# Patient Record
Sex: Female | Born: 2000
Health system: Southern US, Community
[De-identification: ages and names within clinical notes are randomized; demographics above are authoritative.]

## PROBLEM LIST (undated history)

## (undated) DIAGNOSIS — J302 Other seasonal allergic rhinitis: Secondary | ICD-10-CM

## (undated) DIAGNOSIS — F329 Major depressive disorder, single episode, unspecified: Secondary | ICD-10-CM

## (undated) DIAGNOSIS — F32A Depression, unspecified: Secondary | ICD-10-CM

## (undated) HISTORY — DX: Major depressive disorder, single episode, unspecified: F32.9

## (undated) HISTORY — DX: Depression, unspecified: F32.A

## (undated) HISTORY — DX: Other seasonal allergic rhinitis: J30.2

---
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
## 2018-01-10 DIAGNOSIS — N898 Other specified noninflammatory disorders of vagina: Secondary | ICD-10-CM | POA: Diagnosis not present

## 2018-11-17 DIAGNOSIS — Z3201 Encounter for pregnancy test, result positive: Secondary | ICD-10-CM | POA: Diagnosis not present

## 2018-11-17 DIAGNOSIS — R112 Nausea with vomiting, unspecified: Secondary | ICD-10-CM | POA: Diagnosis not present

## 2019-04-30 ENCOUNTER — Encounter: Payer: Self-pay | Admitting: Primary Care

## 2019-04-30 ENCOUNTER — Ambulatory Visit (INDEPENDENT_AMBULATORY_CARE_PROVIDER_SITE_OTHER): Payer: Self-pay | Admitting: Primary Care

## 2019-04-30 ENCOUNTER — Other Ambulatory Visit: Payer: Self-pay

## 2019-04-30 DIAGNOSIS — K219 Gastro-esophageal reflux disease without esophagitis: Secondary | ICD-10-CM | POA: Insufficient documentation

## 2019-04-30 NOTE — Progress Notes (Signed)
Subjective:    Patient ID: Monique Sutton, female    DOB: 10-02-2001, 18 y.o.   MRN: [id number]  HPI  Virtual Visit via Video Note  I connected with Monique Lookourtney R Sutton on 04/30/19 at 10:40 AM EDT by a video enabled telemedicine application and verified that I am speaking with the correct person using two identifiers.  Location: Patient: Home Provider: Office   I discussed the limitations of evaluation and management by telemedicine and the availability of in person appointments. The patient expressed understanding and agreed to proceed.  History of Present Illness:  Monique Sutton is a 18 year old female who presents today with a chief complaint of nausea.  She will wake every morning and will feel nauseated, will sometimes gag and vomit "clear liquid". She doesn't feel nauseated during the day. She also endorses intermittent esophageal burning with some epigastric discomfort at times. She has a history of GERD as a child and had similar symptoms. She was treated at some point but doesn't remember what she took. Symptoms have been present for the last 6+ months.   She denies diarrhea, fevers, abdominal pain, feeling sick.   Observations/Objective:  Alert and oriented. Appears well, not sickly. No distress. Speaking in complete sentences.   Assessment and Plan:  Symptoms suspicious for GERD, lacks symptoms for acute infection. Discussed triggers for GERD including food, beverages, and also to avoid laying flat within 2 hours after eating at night.  Will have her try famotidine 20 mg daily for several weeks then stop. Discussed that she may resume if symptoms return.  She will call and update if she cannot find Pepcid, consider low dose omeprazole at that point.   Follow Up Instructions:  Start taking famotidine (Pepcid) 20 mg tablets for heartburn. Take 1 tablet by mouth once or twice daily for at least 2-4 weeks.   If your symptoms return after you stop then resume  daily.  Please update me as discussed.  It was a pleasure to see you today! Mayra ReelKate Aronda Burford, NP-C    I discussed the assessment and treatment plan with the patient. The patient was provided an opportunity to ask questions and all were answered. The patient agreed with the plan and demonstrated an understanding of the instructions.   The patient was advised to call back or seek an in-person evaluation if the symptoms worsen or if the condition fails to improve as anticipated.     Doreene NestKatherine K Elizeth Weinrich, NP    Review of Systems  Constitutional: Negative for fever.  Respiratory: Negative for cough.   Gastrointestinal: Negative for abdominal pain.       Esophageal burning, nausea in the morning only       Past Medical History:  Diagnosis Date  . Depression   . Seasonal allergies      Social History   Socioeconomic History  . Marital status: Single    Spouse name: Not on file  . Number of children: Not on file  . Years of education: Not on file  . Highest education level: Not on file  Occupational History  . Not on file  Social Needs  . Financial resource strain: Not on file  . Food insecurity:    Worry: Not on file    Inability: Not on file  . Transportation needs:    Medical: Not on file    Non-medical: Not on file  Tobacco Use  . Smoking status: Passive Smoke Exposure - Never Smoker  . Smokeless tobacco:  Never Used  Substance and Sexual Activity  . Alcohol use: No    Alcohol/week: 0.0 standard drinks  . Drug use: No  . Sexual activity: Not on file  Lifestyle  . Physical activity:    Days per week: Not on file    Minutes per session: Not on file  . Stress: Not on file  Relationships  . Social connections:    Talks on phone: Not on file    Gets together: Not on file    Attends religious service: Not on file    Active member of club or organization: Not on file    Attends meetings of clubs or organizations: Not on file    Relationship status: Not on file  .  Intimate partner violence:    Fear of current or ex partner: Not on file    Emotionally abused: Not on file    Physically abused: Not on file    Forced sexual activity: Not on file  Other Topics Concern  . Not on file  Social History Narrative  . Not on file    No past surgical history on file.  No family history on file.  No Known Allergies  Current Outpatient Medications on File Prior to Visit  Medication Sig Dispense Refill  . FLUoxetine (PROZAC) 20 MG capsule Take 1 capsule (20 mg total) by mouth daily. 30 capsule 0   No current facility-administered medications on file prior to visit.     Temp (!) 91.8 F (33.2 C) (Temporal)   Wt 91 lb (41.3 kg)   LMP 04/29/2019    Objective:   Physical Exam  Constitutional: She is oriented to person, place, and time. She appears well-nourished. She does not have a sickly appearance. She does not appear ill.  Respiratory: Effort normal.  Neurological: She is alert and oriented to person, place, and time.  Psychiatric: She has a normal mood and affect.           Assessment & Plan:

## 2019-04-30 NOTE — Patient Instructions (Signed)
Start taking famotidine (Pepcid) 20 mg tablets for heartburn. Take 1 tablet by mouth once or twice daily for at least 2-4 weeks.   If your symptoms return after you stop then resume daily.  Please update me as discussed.  It was a pleasure to see you today! Mayra Reel, NP-C

## 2019-04-30 NOTE — Assessment & Plan Note (Signed)
Symptoms suspicious for GERD, lacks symptoms for acute infection. Discussed triggers for GERD including food, beverages, and also to avoid laying flat within 2 hours after eating at night.  Will have her try famotidine 20 mg daily for several weeks then stop. Discussed that she may resume if symptoms return.  She will call and update if she cannot find Pepcid, consider low dose omeprazole at that point.

## 2019-07-16 ENCOUNTER — Encounter: Payer: Self-pay | Admitting: Primary Care

## 2020-05-04 ENCOUNTER — Ambulatory Visit (INDEPENDENT_AMBULATORY_CARE_PROVIDER_SITE_OTHER): Payer: 59 | Admitting: Primary Care

## 2020-05-04 ENCOUNTER — Other Ambulatory Visit: Payer: Self-pay

## 2020-05-04 ENCOUNTER — Encounter: Payer: Self-pay | Admitting: Primary Care

## 2020-05-04 VITALS — BP 110/72 | HR 123 | Temp 97.0°F | Ht 61.42 in | Wt 83.8 lb

## 2020-05-04 DIAGNOSIS — F411 Generalized anxiety disorder: Secondary | ICD-10-CM

## 2020-05-04 DIAGNOSIS — Z0000 Encounter for general adult medical examination without abnormal findings: Secondary | ICD-10-CM | POA: Diagnosis not present

## 2020-05-04 MED ORDER — ESCITALOPRAM OXALATE 10 MG PO TABS: 10.0000 mg | ORAL_TABLET | Freq: Every day | ORAL | 1 refills | Status: DC

## 2020-05-04 NOTE — Assessment & Plan Note (Signed)
Currently active with GAD 7 score of 17. She's unfortunately lost several close family members over the last year.   Would not recommend fluoxetine for anxiety symptoms, will trial Lexapro 10 mg. Patient is to take 1/2 tablet daily for 8 days, then advance to 1 full tablet thereafter. We discussed possible side effects of headache, GI upset, drowsiness, and SI/HI. If thoughts of SI/HI develop, we discussed to present to the emergency immediately. Patient verbalized understanding.   Follow up in 6 weeks for re-evaluation.

## 2020-05-04 NOTE — Assessment & Plan Note (Signed)
Immunizations UTD. Pap smear due next year at age 19.  Encouraged a healthy diet. Exam today benign. Labs pending.

## 2020-05-04 NOTE — Patient Instructions (Addendum)
COVID-19 Vaccine Information can be found at: [email or web address] For questions related to vaccine distribution or appointments, please email [email or web address] or call [phone].  COVID 19 [phone] in [place]  [phone] or [email or web address]  -[place]  [phone]  -[place] Vaccination  Stop by the lab prior to leaving today. I will notify you of your results once received.   Start escitalopram (Lexapro) 10 mg once daily for anxiety. Start by taking 1/2 tablet daily for 8 days, then increase to 1 full tablet thereafter.   Please schedule a follow up visit for 6 weeks as discussed.  It was a pleasure to see you today!   Preventive Care 34-27 Years Old, Female Preventive care refers to lifestyle choices and visits with your health care provider that can promote health and wellness. At this stage in your life, you may start seeing a primary care physician instead of a pediatrician. Your health care is now your responsibility. Preventive care for young adults includes:  A yearly physical exam. This is also called an annual wellness visit.  Regular dental and eye exams.  Immunizations.  Screening for certain conditions.  Healthy lifestyle choices, such as diet and exercise. What can I expect for my preventive care visit? Physical exam Your health care provider may check:  Height and weight. These may be used to calculate body mass index (BMI), which is a measurement that tells if you are at a healthy weight.  Heart rate and blood pressure.  Body temperature. Counseling Your health care provider may ask you questions about:  Past medical problems and family medical history.  Alcohol, tobacco, and drug use.  Home and relationship well-being.  Access to firearms.  Emotional well-being.  Diet, exercise, and sleep habits.  Sexual activity and sexual  health.  Method of birth control.  Menstrual cycle.  Pregnancy history. What immunizations do I need?  Influenza (flu) vaccine  This is recommended every year. Tetanus, diphtheria, and pertussis (Tdap) vaccine  You may need a Td booster every 10 years. Varicella (chickenpox) vaccine  You may need this vaccine if you have not already been vaccinated. Human papillomavirus (HPV) vaccine  If recommended by your health care provider, you may need three doses over 6 months. Measles, mumps, and rubella (MMR) vaccine  You may need at least one dose of MMR. You may also need a second dose. Meningococcal conjugate (MenACWY) vaccine  One dose is recommended if you are 27-18 years old and a Market researcher living in a residence hall, or if you have one of several medical conditions. You may also need additional booster doses. Pneumococcal conjugate (PCV13) vaccine  You may need this if you have certain conditions and were not previously vaccinated. Pneumococcal polysaccharide (PPSV23) vaccine  You may need one or two doses if you smoke cigarettes or if you have certain conditions. Hepatitis A vaccine  You may need this if you have certain conditions or if you travel or work in places where you may be exposed to hepatitis A. Hepatitis B vaccine  You may need this if you have certain conditions or if you travel or work in places where you may be exposed to hepatitis B. Haemophilus influenzae type b (Hib) vaccine  You may need this if you have certain risk factors. You may receive vaccines as individual doses or as more than one vaccine together in one shot (combination vaccines). Talk with your health care provider about the risks and benefits of combination vaccines.  What tests do I need? Blood tests  Lipid and cholesterol levels. These may be checked every 5 years starting at age 11.  Hepatitis C test.  Hepatitis B test. Screening  Pelvic exam and Pap test. This  may be done every 3 years starting at age 51.  Sexually transmitted disease (STD) testing, if you are at risk.  BRCA-related cancer screening. This may be done if you have a family history of breast, ovarian, tubal, or peritoneal cancers. Other tests  Tuberculosis skin test.  Vision and hearing tests.  Skin exam.  Breast exam. Follow these instructions at home: Eating and drinking   Eat a diet that includes fresh fruits and vegetables, whole grains, lean protein, and low-fat dairy products.  Drink enough fluid to keep your urine pale yellow.  Do not drink alcohol if: ? Your health care provider tells you not to drink. ? You are pregnant, may be pregnant, or are planning to become pregnant. ? You are under the legal drinking age. In the [place] the legal drinking age is 55.  If you drink alcohol: ? Limit how much you have to 0-1 drink a day. ? Be aware of how much alcohol is in your drink. In the [place] one drink equals one 12 oz bottle of beer (355 mL), one 5 oz glass of wine (148 mL), or one 1 oz glass of hard liquor (44 mL). Lifestyle  Take daily care of your teeth and gums.  Stay active. Exercise at least 30 minutes 5 or more days of the week.  Do not use any products that contain nicotine or tobacco, such as cigarettes, e-cigarettes, and chewing tobacco. If you need help quitting, ask your health care provider.  Do not use drugs.  If you are sexually active, practice safe sex. Use a condom or other form of birth control (contraception) in order to prevent pregnancy and STIs (sexually transmitted infections). If you plan to become pregnant, see your health care provider for a pre-conception visit.  Find healthy ways to cope with stress, such as: ? Meditation, yoga, or listening to music. ? Journaling. ? Talking to a trusted person. ? Spending time with friends and family. Safety  Always wear your seat belt while driving or riding in a vehicle.  Do not drive if you  have been drinking alcohol. Do not ride with someone who has been drinking.  Do not drive when you are tired or distracted. Do not text while driving.  Wear a helmet and other protective equipment during sports activities.  If you have firearms in your house, make sure you follow all gun safety procedures.  Seek help if you have been bullied, physically abused, or sexually abused.  Use the Internet responsibly to avoid dangers such as online bullying and online sex predators. What's next?  Go to your health care provider once a year for a well check visit.  Ask your health care provider how often you should have your eyes and teeth checked.  Stay up to date on all vaccines. This information is not intended to replace advice given to you by your health care provider. Make sure you discuss any questions you have with your health care provider. Document Revised: 11/08/2018 Document Reviewed: 11/08/2018 Elsevier Patient Education  2020 [employer].

## 2020-05-04 NOTE — Progress Notes (Signed)
Subjective:    Patient ID: RYN PEINE, female    DOB: June 06, 2001, 18 y.o.   MRN: [id number]  HPI  This visit occurred during the SARS-CoV-2 public health emergency.  Safety protocols were in place, including screening questions prior to the visit, additional usage of staff PPE, and extensive cleaning of exam room while observing appropriate contact time as indicated for disinfecting solutions.   Ms. Borenstein is a 19 year old female who presents today for complete physical.  She would like to discuss going back on medication for anxiety. Some depression symptoms but mostly struggling with anxiety. She had a very tough year in 2020 as she lost her father, grandfather, and several animals. Symptoms include panic attacks, worry, feeling nervous. GAD 7 score of 17 and PHQ 9 score of 7 today. She denies SI/HI.  Immunizations: -Tetanus: Completed in 2012 -Influenza: Did not complete last season  -Covid-19: Will get soon. -HPV: Completed series.  Diet: She endorses a poor diet.  Exercise: She is not exercising.  Eye exam: No recent exam. Dental exam: Completes semi-annually   BP Readings from Last 3 Encounters:  05/04/20 110/72  10/09/17 (!) 100/64 (15 %, Z = -1.04 /  47 %, Z = -0.08)*  08/21/17 (!) 100/64 (15 %, Z = -1.02 /  47 %, Z = -0.08)*   *BP percentiles are based on the 2017 AAP Clinical Practice Guideline for girls     Review of Systems  Constitutional: Negative for unexpected weight change.  HENT: Negative for rhinorrhea.   Respiratory: Negative for shortness of breath.   Cardiovascular: Negative for chest pain.  Gastrointestinal: Negative for constipation and diarrhea.  Genitourinary: Negative for difficulty urinating and menstrual problem.  Musculoskeletal: Negative for arthralgias and myalgias.  Skin: Negative for rash.  Allergic/Immunologic: Positive for environmental allergies.  Neurological: Negative for dizziness, numbness and headaches.    Psychiatric/Behavioral: The patient is nervous/anxious.        Past Medical History:  Diagnosis Date  . Depression   . Seasonal allergies      Social History   Socioeconomic History  . Marital status: Single    Spouse name: Not on file  . Number of children: Not on file  . Years of education: Not on file  . Highest education level: Not on file  Occupational History  . Not on file  Tobacco Use  . Smoking status: Passive Smoke Exposure - Never Smoker  . Smokeless tobacco: Never Used  Substance and Sexual Activity  . Alcohol use: No    Alcohol/week: 0.0 standard drinks  . Drug use: No  . Sexual activity: Not on file  Other Topics Concern  . Not on file  Social History Narrative  . Not on file   Social Determinants of Health   Financial Resource Strain:   . Difficulty of Paying Living Expenses:   Food Insecurity:   . Worried About Programme researcher, broadcasting/film/video in the Last Year:   . Barista in the Last Year:   Transportation Needs:   . Freight forwarder (Medical):   Marland Kitchen Lack of Transportation (Non-Medical):   Physical Activity:   . Days of Exercise per Week:   . Minutes of Exercise per Session:   Stress:   . Feeling of Stress :   Social Connections:   . Frequency of Communication with Friends and Family:   . Frequency of Social Gatherings with Friends and Family:   . Attends Religious Services:   .  Active Member of Clubs or Organizations:   . Attends Archivist Meetings:   Marland Kitchen Marital Status:   Intimate Partner Violence:   . Fear of Current or Ex-Partner:   . Emotionally Abused:   Marland Kitchen Physically Abused:   . Sexually Abused:     No past surgical history on file.  No family history on file.  No Known Allergies  No current outpatient medications on file prior to visit.   No current facility-administered medications on file prior to visit.    BP 110/72   Pulse (!) 123   Temp (!) 97 F (36.1 C) (Temporal)   Ht 5' 1.42" (1.56 m)   Wt 83 lb  12.8 oz (38 kg)   LMP 04/28/2020   SpO2 97%   BMI 15.62 kg/m    Objective:   Physical Exam  Constitutional: She is oriented to person, place, and time. She appears well-nourished.  HENT:  Right Ear: Tympanic membrane and ear canal normal.  Left Ear: Tympanic membrane and ear canal normal.  Mouth/Throat: Oropharynx is clear and moist.  Eyes: Pupils are equal, round, and reactive to light. EOM are normal.  Cardiovascular: Normal rate and regular rhythm.  Respiratory: Effort normal and breath sounds normal.  GI: Soft. Bowel sounds are normal. There is no abdominal tenderness.  Musculoskeletal:        General: Normal range of motion.     Cervical back: Neck supple.  Neurological: She is alert and oriented to person, place, and time. No cranial nerve deficit.  Reflex Scores:      Patellar reflexes are 2+ on the right side and 2+ on the left side. Skin: Skin is warm and dry.  Psychiatric: She has a normal mood and affect.           Assessment & Plan:

## 2020-05-05 LAB — LIPID PANEL
Cholesterol: 133 mg/dL (ref 0–200)
HDL: 58.1 mg/dL (ref >39.00)
LDL Cholesterol: 68 mg/dL (ref 0–99)
NonHDL: 74.91
Total CHOL/HDL Ratio: 2
Triglycerides: 36 mg/dL (ref 0.0–149.0)
VLDL: 7.2 mg/dL (ref 0.0–40.0)

## 2020-05-05 LAB — CBC
HCT: 39.2 % (ref 36.0–46.0)
Hemoglobin: 13.5 g/dL (ref 12.0–15.0)
MCHC: 34.3 g/dL (ref 30.0–36.0)
MCV: 87.7 fl (ref 78.0–100.0)
Platelets: 199 10*3/uL (ref 150.0–400.0)
RBC: 4.47 Mil/uL (ref 3.87–5.11)
RDW: 13.2 % (ref 11.5–14.6)
WBC: 5.2 10*3/uL (ref 4.5–10.5)

## 2020-05-05 LAB — COMPREHENSIVE METABOLIC PANEL
ALT: 10 U/L (ref 0–35)
AST: 13 U/L (ref 0–37)
Albumin: 4.6 g/dL (ref 3.5–5.2)
Alkaline Phosphatase: 52 U/L (ref 39–117)
BUN: 9 mg/dL (ref 6–23)
CO2: 25 mEq/L (ref 19–32)
Calcium: 9.2 mg/dL (ref 8.4–10.5)
Chloride: 106 mEq/L (ref 96–112)
Creatinine, Ser: 0.66 mg/dL (ref 0.40–1.20)
GFR: 113.58 mL/min (ref >60.00)
Glucose, Bld: 90 mg/dL (ref 70–99)
Potassium: 3.6 mEq/L (ref 3.5–5.1)
Sodium: 137 mEq/L (ref 135–145)
Total Bilirubin: 0.7 mg/dL (ref 0.2–1.2)
Total Protein: 6.8 g/dL (ref 6.0–8.3)

## 2020-06-15 ENCOUNTER — Ambulatory Visit: Payer: Medicaid Other | Admitting: Primary Care

## 2020-06-16 ENCOUNTER — Other Ambulatory Visit: Payer: Self-pay

## 2020-06-16 ENCOUNTER — Ambulatory Visit (INDEPENDENT_AMBULATORY_CARE_PROVIDER_SITE_OTHER): Payer: 59 | Admitting: Primary Care

## 2020-06-16 ENCOUNTER — Encounter: Payer: Self-pay | Admitting: Primary Care

## 2020-06-16 DIAGNOSIS — F3342 Major depressive disorder, recurrent, in full remission: Secondary | ICD-10-CM | POA: Diagnosis not present

## 2020-06-16 DIAGNOSIS — F411 Generalized anxiety disorder: Secondary | ICD-10-CM | POA: Diagnosis not present

## 2020-06-16 NOTE — Assessment & Plan Note (Signed)
Improved on Lexapro 10 mg, agree to a slight dose increase of 15 mg. Discussed that she could go up to 20 mg if needed.  She has plenty of medication in her current bottle, she will start with 1 and 1/2 tablets and update in a few weeks. Will need to send either a refill of Lexapro 10 mg with dosing of 1 and 1/2 tablets, or Lexapro 20 mg.   Denies SI/HI.  

## 2020-06-16 NOTE — Progress Notes (Signed)
Subjective:    Patient ID: Monique Sutton, female    DOB: 02-15-2001, 18 y.o.   MRN: [id number]  HPI  This visit occurred during the SARS-CoV-2 public health emergency.  Safety protocols were in place, including screening questions prior to the visit, additional usage of staff PPE, and extensive cleaning of exam room while observing appropriate contact time as indicated for disinfecting solutions.   Monique Sutton is a 19 year old female who presents today for follow up of anxiety and depression.   She was last evaluated in early June 2021 for symptoms of panic attacks, worry, feeling nervous, sadness due to loss of loved ones. GAD 7 score of 17 and PHQ 9 score of 7 so we initiated Lexapro 10 mg and asked her to follow up today.  Since her last visit she's doing better. She's changed jobs which has helped reduce a lot of symptoms. Positive effects from Lexapro include feeling happier, feeling less angry, more patience. She continues to notice irritability, some worry, some anxiousness. She would like to continue Lexapro but would like a small dose increase to 15 mg. She feels that she may further benefit.   She denies SI/HI, GI upset, headaches.   Review of Systems  Gastrointestinal: Negative for nausea.  Neurological: Negative for dizziness and headaches.  Psychiatric/Behavioral: Negative for suicidal ideas.       See HPI       Past Medical History:  Diagnosis Date  . Depression   . Seasonal allergies      Social History   Socioeconomic History  . Marital status: Single    Spouse name: Not on file  . Number of children: Not on file  . Years of education: Not on file  . Highest education level: Not on file  Occupational History  . Not on file  Tobacco Use  . Smoking status: Passive Smoke Exposure - Never Smoker  . Smokeless tobacco: Never Used  Substance and Sexual Activity  . Alcohol use: No    Alcohol/week: 0.0 standard drinks  . Drug use: No  . Sexual activity:  Not on file  Other Topics Concern  . Not on file  Social History Narrative  . Not on file   Social Determinants of Health   Financial Resource Strain:   . Difficulty of Paying Living Expenses:   Food Insecurity:   . Worried About Programme researcher, broadcasting/film/video in the Last Year:   . Barista in the Last Year:   Transportation Needs:   . Freight forwarder (Medical):   Marland Kitchen Lack of Transportation (Non-Medical):   Physical Activity:   . Days of Exercise per Week:   . Minutes of Exercise per Session:   Stress:   . Feeling of Stress :   Social Connections:   . Frequency of Communication with Friends and Family:   . Frequency of Social Gatherings with Friends and Family:   . Attends Religious Services:   . Active Member of Clubs or Organizations:   . Attends Banker Meetings:   Marland Kitchen Marital Status:   Intimate Partner Violence:   . Fear of Current or Ex-Partner:   . Emotionally Abused:   Marland Kitchen Physically Abused:   . Sexually Abused:     No past surgical history on file.  No family history on file.  No Known Allergies  Current Outpatient Medications on File Prior to Visit  Medication Sig Dispense Refill  . escitalopram (LEXAPRO) 10 MG tablet Take  1 tablet (10 mg total) by mouth daily. For anxiety and depression. 30 tablet 1   No current facility-administered medications on file prior to visit.    BP 102/70   Pulse 88   Temp (!) 97.2 F (36.2 C) (Temporal)   Ht 5' 1.5" (1.562 m)   Wt 84 lb 4 oz (38.2 kg)   LMP 05/28/2020   SpO2 98%   BMI 15.66 kg/m    Objective:   Physical Exam Cardiovascular:     Rate and Rhythm: Normal rate and regular rhythm.  Pulmonary:     Effort: Pulmonary effort is normal.     Breath sounds: Normal breath sounds.  Musculoskeletal:     Cervical back: Neck supple.  Skin:    General: Skin is warm and dry.  Psychiatric:        Mood and Affect: Mood normal.            Assessment & Plan:

## 2020-06-16 NOTE — Assessment & Plan Note (Signed)
Improved on Lexapro 10 mg, agree to a slight dose increase of 15 mg. Discussed that she could go up to 20 mg if needed.  She has plenty of medication in her current bottle, she will start with 1 and 1/2 tablets and update in a few weeks. Will need to send either a refill of Lexapro 10 mg with dosing of 1 and 1/2 tablets, or Lexapro 20 mg.   Denies SI/HI.

## 2020-06-16 NOTE — Patient Instructions (Signed)
We've increased the dose of your Lexapro to 15 mg. You can take 1 and 1/2 tablets daily.   Please update me in a few weeks, we can increase to 20 mg if needed.  It was a pleasure to see you today!

## 2020-06-29 ENCOUNTER — Other Ambulatory Visit: Payer: Self-pay | Admitting: Primary Care

## 2020-07-03 ENCOUNTER — Other Ambulatory Visit: Payer: Self-pay | Admitting: Primary Care

## 2020-07-06 ENCOUNTER — Other Ambulatory Visit: Payer: Self-pay | Admitting: Primary Care

## 2020-07-06 DIAGNOSIS — F411 Generalized anxiety disorder: Secondary | ICD-10-CM

## 2020-07-06 MED ORDER — ESCITALOPRAM OXALATE 10 MG PO TABS: 10.0000 mg | ORAL_TABLET | Freq: Every day | ORAL | 0 refills | Status: DC

## 2020-07-08 ENCOUNTER — Telehealth: Payer: Self-pay | Admitting: *Deleted

## 2020-07-08 NOTE — Telephone Encounter (Signed)
Spoken to patient and stated that citalopram and escitalopram are two different medication. I went into detail when the change was and discuss the supposed regime.    Confirm with patient that she is taking escitalopram 15 mg which is a 1 and 1/2 tablet. Patient stated yes, she is and only taking 1 and 1/2 week but will update.

## 2020-07-08 NOTE — Telephone Encounter (Signed)
Patient left a voicemail stating that she has questions about the script that was sent in for Escitalopram. Patient requested a call back to discuss.

## 2020-09-15 ENCOUNTER — Other Ambulatory Visit: Payer: Self-pay | Admitting: Primary Care

## 2020-09-15 DIAGNOSIS — F411 Generalized anxiety disorder: Secondary | ICD-10-CM

## 2020-09-16 NOTE — Telephone Encounter (Signed)
Called mail box full will send my chart message to see how she is taking.

## 2020-09-19 ENCOUNTER — Other Ambulatory Visit: Payer: Self-pay | Admitting: Primary Care

## 2020-10-02 ENCOUNTER — Telehealth: Payer: Self-pay | Admitting: Primary Care

## 2020-10-02 NOTE — Telephone Encounter (Signed)
Pt mom called in due to she received a collection bill and nothing was sent to the mom insurance and the claims are from 04/2020 and 05/2020. Please advise

## 2020-12-23 ENCOUNTER — Other Ambulatory Visit: Payer: Medicaid Other

## 2020-12-23 DIAGNOSIS — Z20822 Contact with and (suspected) exposure to covid-19: Secondary | ICD-10-CM

## 2020-12-24 LAB — SARS-COV-2, NAA 2 DAY TAT

## 2020-12-24 LAB — NOVEL CORONAVIRUS, NAA: SARS-CoV-2, NAA: DETECTED — AB

## 2020-12-28 NOTE — Telephone Encounter (Signed)
After investigation and discussing with billing, I have sent an email over to charge correction to have them refile the claims with the correct insurance company as well as remove from collections.

## 2020-12-29 NOTE — Telephone Encounter (Signed)
Patient's mother is aware that this has been refiled with the insurance

## 2021-01-20 ENCOUNTER — Other Ambulatory Visit: Payer: Self-pay

## 2021-01-20 DIAGNOSIS — F411 Generalized anxiety disorder: Secondary | ICD-10-CM

## 2021-01-20 MED ORDER — ESCITALOPRAM OXALATE 10 MG PO TABS: 15.0000 mg | ORAL_TABLET | Freq: Every day | ORAL | 1 refills | Status: DC

## 2021-04-30 ENCOUNTER — Other Ambulatory Visit: Payer: Self-pay

## 2021-04-30 DIAGNOSIS — F411 Generalized anxiety disorder: Secondary | ICD-10-CM

## 2021-04-30 MED ORDER — ESCITALOPRAM OXALATE 20 MG PO TABS
20.0000 mg | ORAL_TABLET | Freq: Every day | ORAL | 0 refills | Status: DC
  Filled 2021-04-30 – 2021-05-19 (×2): qty 90, 90d supply, fill #0

## 2021-05-11 ENCOUNTER — Other Ambulatory Visit: Payer: Self-pay

## 2021-05-11 ENCOUNTER — Ambulatory Visit: Payer: Medicaid Other | Admitting: Primary Care

## 2021-05-19 ENCOUNTER — Other Ambulatory Visit: Payer: Self-pay

## 2021-11-23 NOTE — Telephone Encounter (Signed)
Noted. Patient was notified via MyChart in June 2022 that she needed to set up a follow up visit for July 2022 and she did not.  See response to MyChart message.

## 2021-11-30 NOTE — Telephone Encounter (Signed)
Joellen, will you get her scheduled for 12/02/21 at 3:20 slot. I put a hold on the time with her name.

## 2021-12-01 NOTE — Telephone Encounter (Signed)
Appointment made. No further action needed

## 2021-12-02 ENCOUNTER — Other Ambulatory Visit: Payer: Self-pay

## 2021-12-02 ENCOUNTER — Telehealth: Payer: BC Managed Care – PPO | Admitting: Primary Care

## 2022-01-24 ENCOUNTER — Encounter: Payer: BC Managed Care – PPO | Admitting: Adult Health

## 2022-08-01 ENCOUNTER — Other Ambulatory Visit: Payer: Self-pay

## 2022-08-01 MED ORDER — ONDANSETRON 4 MG PO TBDP
ORAL_TABLET | ORAL | 0 refills | Status: DC
  Filled 2022-08-01: qty 12, 4d supply, fill #0

## 2022-08-01 MED ORDER — CEFDINIR 300 MG PO CAPS
ORAL_CAPSULE | ORAL | 0 refills | Status: DC
  Filled 2022-08-01: qty 20, 10d supply, fill #0

## 2022-08-01 MED ORDER — MECLIZINE HCL 25 MG PO TABS
12.5000 mg | ORAL_TABLET | Freq: Three times a day (TID) | ORAL | 0 refills | Status: DC | PRN
  Filled 2022-08-01: qty 20, 7d supply, fill #0

## 2022-08-02 ENCOUNTER — Other Ambulatory Visit: Payer: Self-pay

## 2023-02-01 ENCOUNTER — Ambulatory Visit: Payer: BC Managed Care – PPO | Admitting: Family

## 2023-02-15 ENCOUNTER — Encounter: Payer: Self-pay | Admitting: Primary Care

## 2023-02-15 ENCOUNTER — Ambulatory Visit (INDEPENDENT_AMBULATORY_CARE_PROVIDER_SITE_OTHER): Payer: BC Managed Care – PPO | Admitting: Primary Care

## 2023-02-15 VITALS — BP 110/60 | HR 90 | Temp 98.9°F | Ht 61.5 in | Wt 87.2 lb

## 2023-02-15 DIAGNOSIS — F411 Generalized anxiety disorder: Secondary | ICD-10-CM | POA: Diagnosis not present

## 2023-02-15 DIAGNOSIS — F3342 Major depressive disorder, recurrent, in full remission: Secondary | ICD-10-CM | POA: Diagnosis not present

## 2023-02-15 MED ORDER — ESCITALOPRAM OXALATE 10 MG PO TABS: 10.0000 mg | ORAL_TABLET | Freq: Every day | ORAL | 3 refills | Status: DC

## 2023-02-15 NOTE — Patient Instructions (Signed)
Start Lexapro 10 mg for anxiety. Take 1/2 tablet by mouth once daily for about one week, then increase to 1 full tablet thereafter.   Please update me via MyChart in 1 month.  It was a pleasure to see you today!

## 2023-02-15 NOTE — Assessment & Plan Note (Signed)
Uncontrolled.  Will resume Lexapro at 10 mg daily. Discussed to start with 1/2 tablet daily x a few days, then increase to 1 full tablet.  She will update in about 4 weeks via MyChart.

## 2023-02-15 NOTE — Progress Notes (Signed)
Subjective:    Patient ID: Monique Sutton, female    DOB: [birth year], 22 y.o.   MRN: [id number]  Anxiety Symptoms include nervous/anxious behavior. Patient reports no chest pain or dizziness.      Monique Sutton is a very pleasant 22 y.o. female with a history of GAD, depression who presents today to discuss anxiety.   She has not been seen since July 2021.   She has not taken Lexapro in about one year but feels that she needs to resume. Symptoms include thinking worst case scenario, irritability, feeling anxious. Symptoms began around Fall 2023 after a long term relationship breakup.   She denies SI/HI, feeling depressed. She had no problems when taking Lexapro previously and felt well managed.    Review of Systems  Cardiovascular:  Negative for chest pain.  Neurological:  Negative for dizziness and headaches.  Psychiatric/Behavioral:  The patient is nervous/anxious.        See HPI         Past Medical History:  Diagnosis Date   Depression    Seasonal allergies     Social History   Socioeconomic History   Marital status: Single    Spouse name: Not on file   Number of children: Not on file   Years of education: Not on file   Highest education level: Not on file  Occupational History   Not on file  Tobacco Use   Smoking status: Some Days    Types: Cigarettes    Passive exposure: Yes   Smokeless tobacco: Never  Substance and Sexual Activity   Alcohol use: No    Alcohol/week: 0.0 standard drinks of alcohol   Drug use: Yes    Types: Marijuana   Sexual activity: Not on file  Other Topics Concern   Not on file  Social History Narrative   Not on file   Social Determinants of Health   Financial Resource Strain: Not on file  Food Insecurity: Not on file  Transportation Needs: Not on file  Physical Activity: Not on file  Stress: Not on file  Social Connections: Not on file  Intimate Partner Violence: Not on file    History reviewed. No  pertinent surgical history.  History reviewed. No pertinent family history.  No Known Allergies  No current outpatient medications on file prior to visit.   No current facility-administered medications on file prior to visit.    BP 110/60 (BP Location: Left Arm, Patient Position: Sitting)   Pulse 90   Temp 98.9 F (37.2 C) (Skin)   Ht 5' 1.5" (1.562 m)   Wt 87 lb 4 oz (39.6 kg)   LMP 01/25/2023   SpO2 98%   BMI 16.22 kg/m  Objective:   Physical Exam Cardiovascular:     Rate and Rhythm: Normal rate and regular rhythm.  Pulmonary:     Effort: Pulmonary effort is normal.     Breath sounds: Normal breath sounds.  Musculoskeletal:     Cervical back: Neck supple.  Skin:    General: Skin is warm and dry.           Assessment & Plan:  GAD (generalized anxiety disorder) Assessment & Plan: Uncontrolled.  Will resume Lexapro at 10 mg daily. Discussed to start with 1/2 tablet daily x a few days, then increase to 1 full tablet.  She will update in about 4 weeks via MyChart.  Orders: -     Escitalopram Oxalate; Take 1 tablet (10 mg total) by  mouth daily. For anxiety.  Dispense: 90 tablet; Refill: 3  Recurrent major depressive disorder, in full remission [facility]) Assessment & Plan: Denies concerns today.  Will resume Lexapro at 10 mg for anxiety.         Pleas Koch, NP

## 2023-02-15 NOTE — Assessment & Plan Note (Signed)
Denies concerns today.  Will resume Lexapro at 10 mg for anxiety.

## 2023-05-31 LAB — HM PAP SMEAR

## 2024-01-29 ENCOUNTER — Telehealth: Admitting: Physician Assistant

## 2024-01-29 DIAGNOSIS — R6889 Other general symptoms and signs: Secondary | ICD-10-CM

## 2024-01-29 DIAGNOSIS — J069 Acute upper respiratory infection, unspecified: Secondary | ICD-10-CM | POA: Diagnosis not present

## 2024-01-29 MED ORDER — FLUTICASONE PROPIONATE 50 MCG/ACT NA SUSP: 2.0000 | Freq: Every day | NASAL | 0 refills | Status: DC

## 2024-01-29 MED ORDER — OSELTAMIVIR PHOSPHATE 75 MG PO CAPS: 75.0000 mg | ORAL_CAPSULE | Freq: Two times a day (BID) | ORAL | 0 refills | Status: DC

## 2024-01-29 MED ORDER — BENZONATATE 100 MG PO CAPS
100.0000 mg | ORAL_CAPSULE | Freq: Three times a day (TID) | ORAL | 0 refills | Status: DC | PRN
Start: 2024-01-29 — End: 2024-04-25

## 2024-01-29 NOTE — Progress Notes (Signed)

## 2024-01-29 NOTE — Addendum Note (Signed)
 Addended by: Margaretann Loveless on: 01/29/2024 07:57 PM   Modules accepted: Orders

## 2024-03-22 ENCOUNTER — Other Ambulatory Visit: Payer: Self-pay | Admitting: Primary Care

## 2024-03-22 DIAGNOSIS — F411 Generalized anxiety disorder: Secondary | ICD-10-CM

## 2024-03-22 NOTE — Telephone Encounter (Signed)
 Called pt. No answer and Unable to leave voicemail.

## 2024-03-22 NOTE — Telephone Encounter (Signed)
 Patient is due for CPE/follow up, this will be required prior to any further refills.  Please schedule, thank you!

## 2024-04-13 ENCOUNTER — Other Ambulatory Visit: Payer: Self-pay | Admitting: Primary Care

## 2024-04-13 DIAGNOSIS — F411 Generalized anxiety disorder: Secondary | ICD-10-CM

## 2024-04-15 NOTE — Telephone Encounter (Signed)
 Patient is due for CPE/follow up, this will be required prior to any further refills.  Please schedule, thank you!

## 2024-04-16 NOTE — Telephone Encounter (Signed)
 Spoke to pt, scheduled cpe for 04/25/24

## 2024-04-25 ENCOUNTER — Encounter: Payer: Self-pay | Admitting: Primary Care

## 2024-04-25 ENCOUNTER — Ambulatory Visit (INDEPENDENT_AMBULATORY_CARE_PROVIDER_SITE_OTHER): Admitting: Primary Care

## 2024-04-25 VITALS — BP 116/70 | HR 103 | Temp 97.9°F | Ht 61.5 in | Wt 99.0 lb

## 2024-04-25 DIAGNOSIS — Z0000 Encounter for general adult medical examination without abnormal findings: Secondary | ICD-10-CM | POA: Diagnosis not present

## 2024-04-25 DIAGNOSIS — F411 Generalized anxiety disorder: Secondary | ICD-10-CM

## 2024-04-25 DIAGNOSIS — F3342 Major depressive disorder, recurrent, in full remission: Secondary | ICD-10-CM | POA: Diagnosis not present

## 2024-04-25 MED ORDER — ESCITALOPRAM OXALATE 10 MG PO TABS: 10.0000 mg | ORAL_TABLET | Freq: Every day | ORAL | 3 refills | Status: DC

## 2024-04-25 NOTE — Progress Notes (Signed)
 Subjective:    Patient ID: Monique Sutton, female    DOB: 2001-08-18, 23 y.o.   MRN: [id number]  HPI  Monique Sutton is a very pleasant 23 y.o. female who presents today for complete physical and follow up of chronic conditions.  Immunizations: -Tetanus: Completed in 2012, declines today.  -HPV: Completed series  Diet: Fair diet.  Exercise: No regular exercise.  Eye exam: Completed years ago. Dental exam: Completes semi-annually    Pap Smear: Completed in 2024 per GYN      Review of Systems  Constitutional:  Negative for unexpected weight change.  HENT:  Negative for rhinorrhea.   Respiratory:  Negative for cough and shortness of breath.   Cardiovascular:  Negative for chest pain.  Gastrointestinal:  Negative for constipation and diarrhea.  Genitourinary:  Negative for difficulty urinating and menstrual problem.  Musculoskeletal:  Negative for arthralgias and myalgias.  Skin:  Negative for rash.  Allergic/Immunologic: Negative for environmental allergies.  Neurological:  Negative for dizziness, numbness and headaches.  Psychiatric/Behavioral:  The patient is not nervous/anxious.          Past Medical History:  Diagnosis Date   Depression    Seasonal allergies     Social History   Socioeconomic History   Marital status: Single    Spouse name: Not on file   Number of children: Not on file   Years of education: Not on file   Highest education level: Not on file  Occupational History   Not on file  Tobacco Use   Smoking status: Some Days    Types: Cigarettes    Passive exposure: Yes   Smokeless tobacco: Never  Substance and Sexual Activity   Alcohol use: No    Alcohol/week: 0.0 standard drinks of alcohol   Drug use: Yes    Types: Marijuana   Sexual activity: Not on file  Other Topics Concern   Not on file  Social History Narrative   Not on file   Social Drivers of Health   Financial Resource Strain: Patient Declined (04/24/2024)    Overall Financial Resource Strain (CARDIA)    Difficulty of Paying Living Expenses: Patient declined  Food Insecurity: Patient Declined (04/24/2024)   Hunger Vital Sign    Worried About Running Out of Food in the Last Year: Patient declined    Ran Out of Food in the Last Year: Patient declined  Transportation Needs: Patient Declined (04/24/2024)   PRAPARE - Administrator, Civil Service (Medical): Patient declined    Lack of Transportation (Non-Medical): Patient declined  Physical Activity: Unknown (04/24/2024)   Exercise Vital Sign    Days of Exercise per Week: Patient declined    Minutes of Exercise per Session: Not on file  Stress: Patient Declined (04/24/2024)   [employer] of Occupational Health - Occupational Stress Questionnaire    Feeling of Stress : Patient declined  Social Connections: Unknown (04/24/2024)   Social Connection and Isolation Panel [NHANES]    Frequency of Communication with Friends and Family: Patient declined    Frequency of Social Gatherings with Friends and Family: Patient declined    Attends Religious Services: Not on Insurance claims handler of Clubs or Organizations: Patient declined    Attends Banker Meetings: Not on file    Marital Status: Patient declined  Intimate Partner Violence: Not on file    History reviewed. No pertinent surgical history.  History reviewed. No pertinent family history.  No Known Allergies  No current outpatient medications on file prior to visit.   No current facility-administered medications on file prior to visit.    BP 116/70   Pulse (!) 103   Temp 97.9 F (36.6 C) (Temporal)   Ht 5' 1.5" (1.562 m)   Wt 99 lb (44.9 kg)   LMP  (LMP Unknown)   SpO2 100%   BMI 18.40 kg/m  Objective:   Physical Exam HENT:     Right Ear: Tympanic membrane and ear canal normal.     Left Ear: Tympanic membrane and ear canal normal.  Eyes:     Pupils: Pupils are equal, round, and reactive to light.   Cardiovascular:     Rate and Rhythm: Normal rate and regular rhythm.  Pulmonary:     Effort: Pulmonary effort is normal.     Breath sounds: Normal breath sounds.  Abdominal:     General: Bowel sounds are normal.     Palpations: Abdomen is soft.     Tenderness: There is no abdominal tenderness.  Musculoskeletal:        General: Normal range of motion.     Cervical back: Neck supple.  Skin:    General: Skin is warm and dry.  Neurological:     Mental Status: She is alert and oriented to person, place, and time.     Cranial Nerves: No cranial nerve deficit.     Deep Tendon Reflexes:     Reflex Scores:      Patellar reflexes are 2+ on the right side and 2+ on the left side. Psychiatric:        Mood and Affect: Mood normal.           Assessment & Plan:  Recurrent major depressive disorder, in full remission (HCC) Assessment & Plan: Controlled.  Continue Lexapro  10 mg daily.   GAD (generalized anxiety disorder) Assessment & Plan: Controlled.  Continue Lexapro  10 mg daily.  Orders: -     Escitalopram  Oxalate; Take 1 tablet (10 mg total) by mouth daily. For anxiety.  Dispense: 90 tablet; Refill: 3  Preventative health care Assessment & Plan: Tetanus due, she declines today but will set up a nurse visit. Pap smear UTD.  Follows with GYN  Discussed the importance of a healthy diet and regular exercise in order for weight loss, and to reduce the risk of further co-morbidity.  Exam stable. Labs pending.  Follow up in 1 year for repeat physical.          [facility] Kamela Blansett, NP

## 2024-04-25 NOTE — Assessment & Plan Note (Signed)
Controlled.  Continue Lexapro 10 mg daily. 

## 2024-04-25 NOTE — Assessment & Plan Note (Signed)
 Tetanus due, she declines today but will set up a nurse visit. Pap smear UTD.  Follows with GYN  Discussed the importance of a healthy diet and regular exercise in order for weight loss, and to reduce the risk of further co-morbidity.  Exam stable. Labs pending.  Follow up in 1 year for repeat physical.

## 2024-04-25 NOTE — Patient Instructions (Signed)
 Schedule a nurse visit to complete your tetanus shot when you are ready.  It was a pleasure to see you today!

## 2024-05-01 ENCOUNTER — Encounter: Payer: Self-pay | Admitting: Primary Care

## 2024-11-22 DIAGNOSIS — F411 Generalized anxiety disorder: Secondary | ICD-10-CM

## 2024-11-23 MED ORDER — ESCITALOPRAM OXALATE 10 MG PO TABS: 10.0000 mg | ORAL_TABLET | Freq: Every day | ORAL | 0 refills | Status: AC
